# Patient Record
Sex: Male | Born: 1970 | Race: White | Hispanic: No | Marital: Married | Smoking: Never smoker
Health system: Southern US, Community
[De-identification: ages and names within clinical notes are randomized; demographics above are authoritative.]

## PROBLEM LIST (undated history)

## (undated) DIAGNOSIS — M1990 Unspecified osteoarthritis, unspecified site: Secondary | ICD-10-CM

---

## 2016-06-19 ENCOUNTER — Encounter
Admission: RE | Admit: 2016-06-19 | Discharge: 2016-06-19 | Disposition: A | Discharge status: Home / Self Care | Payer: BLUE CROSS/BLUE SHIELD | Source: Ambulatory Visit | Attending: Orthopedic Surgery | Admitting: Orthopedic Surgery

## 2016-06-19 ENCOUNTER — Other Ambulatory Visit: Payer: Self-pay

## 2016-06-19 DIAGNOSIS — Z01812 Encounter for preprocedural laboratory examination: Secondary | ICD-10-CM | POA: Diagnosis present

## 2016-06-19 HISTORY — DX: Unspecified osteoarthritis, unspecified site: M19.90

## 2016-06-19 LAB — COMPREHENSIVE METABOLIC PANEL
ALBUMIN: 4.8 g/dL (ref 3.5–5.0)
ALK PHOS: 64 U/L (ref 38–126)
ALT: 26 U/L (ref 17–63)
ANION GAP: 8 (ref 5–15)
AST: 27 U/L (ref 15–41)
BILIRUBIN TOTAL: 0.7 mg/dL (ref 0.3–1.2)
BUN: 14 mg/dL (ref 6–20)
CALCIUM: 9.9 mg/dL (ref 8.9–10.3)
CO2: 25 mmol/L (ref 22–32)
Chloride: 105 mmol/L (ref 101–111)
Creatinine, Ser: 0.92 mg/dL (ref 0.61–1.24)
Glucose, Bld: 93 mg/dL (ref 65–99)
POTASSIUM: 4.1 mmol/L (ref 3.5–5.1)
Sodium: 138 mmol/L (ref 135–145)
TOTAL PROTEIN: 7.7 g/dL (ref 6.5–8.1)

## 2016-06-19 LAB — CBC
HEMATOCRIT: 45.4 % (ref 40.0–52.0)
HEMOGLOBIN: 15.7 g/dL (ref 13.0–18.0)
MCH: 31.5 pg (ref 26.0–34.0)
MCHC: 34.5 g/dL (ref 32.0–36.0)
MCV: 91.3 fL (ref 80.0–100.0)
Platelets: 270 10*3/uL (ref 150–440)
RBC: 4.97 MIL/uL (ref 4.40–5.90)
RDW: 13.4 % (ref 11.5–14.5)
WBC: 7.5 10*3/uL (ref 3.8–10.6)

## 2016-06-19 LAB — SURGICAL PCR SCREEN
MRSA, PCR: NEGATIVE
Staphylococcus aureus: POSITIVE — AB

## 2016-06-19 LAB — TYPE AND SCREEN
ABO/RH(D): O POS
ANTIBODY SCREEN: NEGATIVE

## 2016-06-19 LAB — PROTIME-INR
INR: 0.97
PROTHROMBIN TIME: 12.9 s (ref 11.4–15.2)

## 2016-06-19 LAB — APTT: APTT: 33 s (ref 24–36)

## 2016-06-19 LAB — SEDIMENTATION RATE: SED RATE: 3 mm/h (ref 0–15)

## 2016-06-19 LAB — C-REACTIVE PROTEIN: CRP: 1.3 mg/dL — AB (ref <1.0)

## 2016-06-19 NOTE — Patient Instructions (Signed)
  Your procedure is scheduled on: July 03, 2016 (Wednesday) Report to Same Day Surgery 2nd floor medical mall [facility](Medical Mall Entrance-take elevator on left to 2nd floor.  Check in with surgery information desk.) To find out your arrival time please call [phone] between 1PM - 3PM on  July 02, 2016 (Tuesday)  Remember: Instructions that are not followed completely may result in serious medical risk, up to and including death, or upon the discretion of your surgeon and anesthesiologist your surgery may need to be rescheduled.    _x___ 1. Do not eat food or drink liquids after midnight. No gum chewing or hard candies.     __x__ 2. No Alcohol for 24 hours before or after surgery.   __x__3. No Smoking for 24 prior to surgery.   ____  4. Bring all medications with you on the day of surgery if instructed.    __x__ 5. Notify your doctor if there is any change in your medical condition     (cold, fever, infections).     Do not wear jewelry, make-up, hairpins, clips or nail polish.  Do not wear lotions, powders, or perfumes. You may wear deodorant.  Do not shave 48 hours prior to surgery. Men may shave face and neck.  Do not bring valuables to the hospital.    [facility] is not responsible for any belongings or valuables.               Contacts, dentures or bridgework may not be worn into surgery.  Leave your suitcase in the car. After surgery it may be brought to your room.  For patients admitted to the hospital, discharge time is determined by your treatment team.   Patients discharged the day of surgery will not be allowed to drive home.  You will need someone to drive you home and stay with you the night of your procedure.    Please read over the following fact sheets that you were given:   [facility] Preparing for Surgery and or MRSA Information   __ Take these medicines the morning of surgery with A SIP OF WATER:    1.   2.  3.  4.  5.  6.  ____Fleets enema or  Magnesium Citrate as directed.   _x___ Use CHG Soap or sage wipes as directed on instruction sheet   ____ Use inhalers on the day of surgery and bring to hospital day of surgery  ____ Stop metformin 2 days prior to surgery    ____ Take 1/2 of usual insulin dose the night before surgery and none on the morning of surgery.          __x__ Stop Aspirin, Coumadin, Pllavix ,Eliquis, Effient, or Pradaxa (NO ASPIRIN)  x__ Stop Anti-inflammatories such as Advil, Aleve, Ibuprofen, Motrin, Naproxen,          Naprosyn, Goodies powders or aspirin products. Ok to take Tylenol.   ____ Stop supplements until after surgery.    ____ Bring C-Pap to the hospital.

## 2016-06-20 LAB — URINALYSIS, ROUTINE W REFLEX MICROSCOPIC
BILIRUBIN URINE: NEGATIVE
Bacteria, UA: NONE SEEN
Glucose, UA: NEGATIVE mg/dL
Ketones, ur: NEGATIVE mg/dL
Leukocytes, UA: NEGATIVE
NITRITE: NEGATIVE
PH: 5 (ref 5.0–8.0)
Protein, ur: NEGATIVE mg/dL
RBC / HPF: NONE SEEN RBC/hpf (ref 0–5)
SPECIFIC GRAVITY, URINE: 1.011 (ref 1.005–1.030)

## 2016-06-20 LAB — URINE CULTURE
CULTURE: NO GROWTH
SPECIAL REQUESTS: NORMAL

## 2016-06-20 NOTE — Pre-Procedure Instructions (Signed)
CALLED POSITIVE STAPH TO TIFFANY AT DR Elenor LegatoHOOTEN,S

## 2016-06-21 NOTE — Pre-Procedure Instructions (Signed)
UA sent to Dr. Hooten for review. 

## 2016-07-02 MED ORDER — CEFAZOLIN SODIUM-DEXTROSE 2-4 GM/100ML-% IV SOLN
2.0000 g | INTRAVENOUS | Status: AC
  Administered 2016-07-03: 2 g via INTRAVENOUS

## 2016-07-02 MED ORDER — TRANEXAMIC ACID 1000 MG/10ML IV SOLN
1000.0000 mg | INTRAVENOUS | Status: AC
  Administered 2016-07-03: 1000 mg via INTRAVENOUS
  Filled 2016-07-02: qty 10

## 2016-07-03 ENCOUNTER — Encounter: Payer: Self-pay | Admitting: *Deleted

## 2016-07-03 ENCOUNTER — Inpatient Hospital Stay
Admission: RE | Admit: 2016-07-03 | Discharge: 2016-07-05 | DRG: 470 | Disposition: A | Discharge status: Home Health | Payer: BLUE CROSS/BLUE SHIELD | Source: Ambulatory Visit | Attending: Orthopedic Surgery | Admitting: Orthopedic Surgery

## 2016-07-03 ENCOUNTER — Inpatient Hospital Stay: Payer: BLUE CROSS/BLUE SHIELD | Admitting: Anesthesiology

## 2016-07-03 ENCOUNTER — Inpatient Hospital Stay: Payer: BLUE CROSS/BLUE SHIELD

## 2016-07-03 ENCOUNTER — Encounter
Admission: RE | Disposition: A | Discharge status: Home Health | Payer: Self-pay | Source: Ambulatory Visit | Attending: Orthopedic Surgery

## 2016-07-03 DIAGNOSIS — R262 Difficulty in walking, not elsewhere classified: Secondary | ICD-10-CM

## 2016-07-03 DIAGNOSIS — M1611 Unilateral primary osteoarthritis, right hip: Secondary | ICD-10-CM | POA: Diagnosis present

## 2016-07-03 DIAGNOSIS — M6281 Muscle weakness (generalized): Secondary | ICD-10-CM

## 2016-07-03 DIAGNOSIS — M109 Gout, unspecified: Secondary | ICD-10-CM | POA: Diagnosis present

## 2016-07-03 DIAGNOSIS — I493 Ventricular premature depolarization: Secondary | ICD-10-CM | POA: Diagnosis not present

## 2016-07-03 DIAGNOSIS — Z96649 Presence of unspecified artificial hip joint: Secondary | ICD-10-CM

## 2016-07-03 HISTORY — PX: TOTAL HIP ARTHROPLASTY: SHX124

## 2016-07-03 LAB — ABO/RH: ABO/RH(D): O POS

## 2016-07-03 SURGERY — ARTHROPLASTY, HIP, TOTAL,POSTERIOR APPROACH
Anesthesia: Spinal | Site: Hip | Laterality: Right | Wound class: Clean

## 2016-07-03 MED ORDER — TETRACAINE HCL 1 % IJ SOLN
INTRAMUSCULAR | Status: DC | PRN
  Administered 2016-07-03: 10 mg via INTRASPINAL

## 2016-07-03 MED ORDER — ONDANSETRON HCL 4 MG/2ML IJ SOLN: 4.0000 mg | Freq: Four times a day (QID) | INTRAMUSCULAR | Status: DC | PRN

## 2016-07-03 MED ORDER — MORPHINE SULFATE (PF) 2 MG/ML IV SOLN
2.0000 mg | INTRAVENOUS | Status: DC | PRN
  Administered 2016-07-03: 2 mg via INTRAVENOUS
  Filled 2016-07-03: qty 1

## 2016-07-03 MED ORDER — FENTANYL CITRATE (PF) 100 MCG/2ML IJ SOLN
INTRAMUSCULAR | Status: DC | PRN
  Administered 2016-07-03 (×2): 50 ug via INTRAVENOUS

## 2016-07-03 MED ORDER — ENOXAPARIN SODIUM 30 MG/0.3ML ~~LOC~~ SOLN
30.0000 mg | Freq: Two times a day (BID) | SUBCUTANEOUS | Status: DC
  Administered 2016-07-04 – 2016-07-05 (×3): 30 mg via SUBCUTANEOUS
  Filled 2016-07-03 (×3): qty 0.3

## 2016-07-03 MED ORDER — LIDOCAINE HCL (PF) 2 % IJ SOLN
INTRAMUSCULAR | Status: DC | PRN
  Administered 2016-07-03: 100 mg
  Administered 2016-07-03: 50 mg

## 2016-07-03 MED ORDER — PROPOFOL 500 MG/50ML IV EMUL
INTRAVENOUS | Status: DC | PRN
  Administered 2016-07-03: 50 ug/kg/min via INTRAVENOUS

## 2016-07-03 MED ORDER — ACETAMINOPHEN 10 MG/ML IV SOLN
INTRAVENOUS | Status: AC
  Filled 2016-07-03: qty 100

## 2016-07-03 MED ORDER — ACETAMINOPHEN 10 MG/ML IV SOLN
INTRAVENOUS | Status: DC | PRN
  Administered 2016-07-03: 1000 mg via INTRAVENOUS

## 2016-07-03 MED ORDER — BISACODYL 10 MG RE SUPP: 10.0000 mg | Freq: Every day | RECTAL | Status: DC | PRN

## 2016-07-03 MED ORDER — FERROUS SULFATE 325 (65 FE) MG PO TABS
325.0000 mg | ORAL_TABLET | Freq: Two times a day (BID) | ORAL | Status: DC
  Administered 2016-07-03 – 2016-07-05 (×4): 325 mg via ORAL
  Filled 2016-07-03 (×4): qty 1

## 2016-07-03 MED ORDER — PROPOFOL 500 MG/50ML IV EMUL
INTRAVENOUS | Status: AC
  Filled 2016-07-03: qty 50

## 2016-07-03 MED ORDER — ALUM & MAG HYDROXIDE-SIMETH 200-200-20 MG/5ML PO SUSP: 30.0000 mL | ORAL | Status: DC | PRN

## 2016-07-03 MED ORDER — MIDAZOLAM HCL 2 MG/2ML IJ SOLN
INTRAMUSCULAR | Status: AC
  Filled 2016-07-03: qty 2

## 2016-07-03 MED ORDER — PROMETHAZINE HCL 25 MG/ML IJ SOLN: 6.2500 mg | INTRAMUSCULAR | Status: DC | PRN

## 2016-07-03 MED ORDER — GLYCOPYRROLATE 0.2 MG/ML IJ SOLN
INTRAMUSCULAR | Status: DC | PRN
  Administered 2016-07-03: 0.2 mg via INTRAVENOUS
  Administered 2016-07-03: 0.1 mg via INTRAVENOUS

## 2016-07-03 MED ORDER — OXYCODONE HCL 5 MG PO TABS
5.0000 mg | ORAL_TABLET | ORAL | Status: DC | PRN
  Administered 2016-07-03 (×2): 10 mg via ORAL
  Administered 2016-07-03: 5 mg via ORAL
  Administered 2016-07-04 – 2016-07-05 (×5): 10 mg via ORAL
  Filled 2016-07-03: qty 2
  Filled 2016-07-03: qty 1
  Filled 2016-07-03 (×6): qty 2

## 2016-07-03 MED ORDER — CHLORHEXIDINE GLUCONATE 4 % EX LIQD: 60.0000 mL | Freq: Once | CUTANEOUS | Status: DC

## 2016-07-03 MED ORDER — TETRACAINE HCL 1 % IJ SOLN
INTRAMUSCULAR | Status: AC
  Filled 2016-07-03: qty 2

## 2016-07-03 MED ORDER — METOCLOPRAMIDE HCL 10 MG PO TABS
10.0000 mg | ORAL_TABLET | Freq: Three times a day (TID) | ORAL | Status: DC
  Administered 2016-07-03 – 2016-07-05 (×7): 10 mg via ORAL
  Filled 2016-07-03 (×6): qty 1

## 2016-07-03 MED ORDER — FENTANYL CITRATE (PF) 100 MCG/2ML IJ SOLN
INTRAMUSCULAR | Status: AC
  Filled 2016-07-03: qty 2

## 2016-07-03 MED ORDER — OXYCODONE HCL 5 MG/5ML PO SOLN: 5.0000 mg | Freq: Once | ORAL | Status: DC | PRN

## 2016-07-03 MED ORDER — FLEET ENEMA 7-19 GM/118ML RE ENEM: 1.0000 | ENEMA | Freq: Once | RECTAL | Status: DC | PRN

## 2016-07-03 MED ORDER — ACETAMINOPHEN 325 MG PO TABS: 650.0000 mg | ORAL_TABLET | Freq: Four times a day (QID) | ORAL | Status: DC | PRN

## 2016-07-03 MED ORDER — NEOMYCIN-POLYMYXIN B GU 40-200000 IR SOLN
Status: AC
  Filled 2016-07-03: qty 20

## 2016-07-03 MED ORDER — PHENYLEPHRINE HCL 10 MG/ML IJ SOLN
INTRAMUSCULAR | Status: DC | PRN
  Administered 2016-07-03 (×2): 100 ug via INTRAVENOUS

## 2016-07-03 MED ORDER — LACTATED RINGERS IV SOLN
INTRAVENOUS | Status: DC
  Administered 2016-07-03 (×3): via INTRAVENOUS

## 2016-07-03 MED ORDER — TRAMADOL HCL 50 MG PO TABS
50.0000 mg | ORAL_TABLET | ORAL | Status: DC | PRN
  Administered 2016-07-03 – 2016-07-05 (×3): 100 mg via ORAL
  Filled 2016-07-03 (×3): qty 2

## 2016-07-03 MED ORDER — FENTANYL CITRATE (PF) 100 MCG/2ML IJ SOLN
25.0000 ug | INTRAMUSCULAR | Status: DC | PRN
  Administered 2016-07-03 (×2): 50 ug via INTRAVENOUS

## 2016-07-03 MED ORDER — CELECOXIB 200 MG PO CAPS
200.0000 mg | ORAL_CAPSULE | Freq: Two times a day (BID) | ORAL | Status: DC
  Administered 2016-07-03 – 2016-07-05 (×5): 200 mg via ORAL
  Filled 2016-07-03 (×5): qty 1

## 2016-07-03 MED ORDER — BUPIVACAINE HCL (PF) 0.5 % IJ SOLN
INTRAMUSCULAR | Status: DC | PRN
  Administered 2016-07-03: 2 mL via INTRATHECAL

## 2016-07-03 MED ORDER — PANTOPRAZOLE SODIUM 40 MG PO TBEC
40.0000 mg | DELAYED_RELEASE_TABLET | Freq: Two times a day (BID) | ORAL | Status: DC
  Administered 2016-07-03 – 2016-07-05 (×5): 40 mg via ORAL
  Filled 2016-07-03 (×5): qty 1

## 2016-07-03 MED ORDER — NEOMYCIN-POLYMYXIN B GU 40-200000 IR SOLN
Status: DC | PRN
  Administered 2016-07-03: 14 mL

## 2016-07-03 MED ORDER — FAMOTIDINE 20 MG PO TABS
20.0000 mg | ORAL_TABLET | Freq: Once | ORAL | Status: AC
  Administered 2016-07-03: 20 mg via ORAL

## 2016-07-03 MED ORDER — OXYCODONE HCL 5 MG PO TABS: 5.0000 mg | ORAL_TABLET | Freq: Once | ORAL | Status: DC | PRN

## 2016-07-03 MED ORDER — LIDOCAINE 2% (20 MG/ML) 5 ML SYRINGE
INTRAMUSCULAR | Status: AC
  Filled 2016-07-03: qty 5

## 2016-07-03 MED ORDER — MAGNESIUM HYDROXIDE 400 MG/5ML PO SUSP
30.0000 mL | Freq: Every day | ORAL | Status: DC | PRN
  Administered 2016-07-05: 30 mL via ORAL
  Filled 2016-07-03: qty 30

## 2016-07-03 MED ORDER — ACETAMINOPHEN 650 MG RE SUPP: 650.0000 mg | Freq: Four times a day (QID) | RECTAL | Status: DC | PRN

## 2016-07-03 MED ORDER — DIPHENHYDRAMINE HCL 12.5 MG/5ML PO ELIX: 12.5000 mg | ORAL_SOLUTION | ORAL | Status: DC | PRN

## 2016-07-03 MED ORDER — ONDANSETRON HCL 4 MG PO TABS: 4.0000 mg | ORAL_TABLET | Freq: Four times a day (QID) | ORAL | Status: DC | PRN

## 2016-07-03 MED ORDER — PHENYLEPHRINE HCL 10 MG/ML IJ SOLN
INTRAMUSCULAR | Status: AC
  Filled 2016-07-03: qty 1

## 2016-07-03 MED ORDER — FENTANYL CITRATE (PF) 100 MCG/2ML IJ SOLN
INTRAMUSCULAR | Status: AC
  Administered 2016-07-03: 50 ug via INTRAVENOUS
  Filled 2016-07-03: qty 2

## 2016-07-03 MED ORDER — EPHEDRINE SULFATE 50 MG/ML IJ SOLN
INTRAMUSCULAR | Status: DC | PRN
  Administered 2016-07-03: 10 mg via INTRAVENOUS

## 2016-07-03 MED ORDER — MIDAZOLAM HCL 5 MG/5ML IJ SOLN
INTRAMUSCULAR | Status: DC | PRN
  Administered 2016-07-03 (×2): 1 mg via INTRAVENOUS
  Administered 2016-07-03: 2 mg via INTRAVENOUS

## 2016-07-03 MED ORDER — SODIUM CHLORIDE 0.9 % IV SOLN
INTRAVENOUS | Status: DC
  Administered 2016-07-03 – 2016-07-04 (×3): via INTRAVENOUS

## 2016-07-03 MED ORDER — SODIUM CHLORIDE 0.9 % IV SOLN: INTRAVENOUS | Status: DC | PRN

## 2016-07-03 MED ORDER — GLYCOPYRROLATE 0.2 MG/ML IJ SOLN
INTRAMUSCULAR | Status: AC
  Filled 2016-07-03: qty 1

## 2016-07-03 MED ORDER — ACETAMINOPHEN 10 MG/ML IV SOLN
1000.0000 mg | Freq: Four times a day (QID) | INTRAVENOUS | Status: AC
  Administered 2016-07-03 – 2016-07-04 (×4): 1000 mg via INTRAVENOUS
  Filled 2016-07-03 (×4): qty 100

## 2016-07-03 MED ORDER — BUPIVACAINE HCL (PF) 0.5 % IJ SOLN
INTRAMUSCULAR | Status: AC
  Filled 2016-07-03: qty 30

## 2016-07-03 MED ORDER — CEFAZOLIN SODIUM-DEXTROSE 2-4 GM/100ML-% IV SOLN
2.0000 g | Freq: Four times a day (QID) | INTRAVENOUS | Status: AC
  Administered 2016-07-03 – 2016-07-04 (×3): 2 g via INTRAVENOUS
  Filled 2016-07-03 (×4): qty 100

## 2016-07-03 MED ORDER — EPHEDRINE 5 MG/ML INJ
INTRAVENOUS | Status: AC
  Filled 2016-07-03: qty 10

## 2016-07-03 MED ORDER — PHENOL 1.4 % MT LIQD
1.0000 | OROMUCOSAL | Status: DC | PRN
  Filled 2016-07-03: qty 177

## 2016-07-03 MED ORDER — TRANEXAMIC ACID 1000 MG/10ML IV SOLN
1000.0000 mg | Freq: Once | INTRAVENOUS | Status: AC
  Administered 2016-07-03: 1000 mg via INTRAVENOUS
  Filled 2016-07-03: qty 10

## 2016-07-03 MED ORDER — ALLOPURINOL 100 MG PO TABS
300.0000 mg | ORAL_TABLET | Freq: Every day | ORAL | Status: DC
  Administered 2016-07-03 – 2016-07-04 (×2): 300 mg via ORAL
  Filled 2016-07-03 (×2): qty 3

## 2016-07-03 MED ORDER — SENNOSIDES-DOCUSATE SODIUM 8.6-50 MG PO TABS
1.0000 | ORAL_TABLET | Freq: Two times a day (BID) | ORAL | Status: DC
  Administered 2016-07-03 – 2016-07-05 (×5): 1 via ORAL
  Filled 2016-07-03 (×5): qty 1

## 2016-07-03 MED ORDER — MENTHOL 3 MG MT LOZG
1.0000 | LOZENGE | OROMUCOSAL | Status: DC | PRN
  Filled 2016-07-03: qty 9

## 2016-07-03 MED ORDER — MEPERIDINE HCL 25 MG/ML IJ SOLN: 6.2500 mg | INTRAMUSCULAR | Status: DC | PRN

## 2016-07-03 MED ORDER — PROPOFOL 10 MG/ML IV BOLUS
INTRAVENOUS | Status: DC | PRN
  Administered 2016-07-03: 20 mg via INTRAVENOUS

## 2016-07-03 MED ORDER — PROPOFOL 10 MG/ML IV BOLUS
INTRAVENOUS | Status: AC
  Filled 2016-07-03: qty 20

## 2016-07-03 SURGICAL SUPPLY — 50 items
BLADE DRUM FLTD (BLADE) ×3 IMPLANT
BLADE SAW 1 (BLADE) ×3 IMPLANT
CANISTER SUCT 1200ML W/VALVE (MISCELLANEOUS) ×3 IMPLANT
CANISTER SUCT 3000ML (MISCELLANEOUS) ×6 IMPLANT
CAPT HIP TOTAL 2 ×3 IMPLANT
CARTRIDGE OIL MAESTRO DRILL (MISCELLANEOUS) ×1 IMPLANT
CATH FOL LEG HOLDER (MISCELLANEOUS) ×3 IMPLANT
CATH TRAY METER 16FR LF (MISCELLANEOUS) ×3 IMPLANT
DIFFUSER MAESTRO (MISCELLANEOUS) ×3 IMPLANT
DRAPE INCISE IOBAN 66X60 STRL (DRAPES) ×3 IMPLANT
DRAPE SHEET LG 3/4 BI-LAMINATE (DRAPES) ×3 IMPLANT
DRSG DERMACEA 8X12 NADH (GAUZE/BANDAGES/DRESSINGS) ×3 IMPLANT
DRSG OPSITE POSTOP 3X4 (GAUZE/BANDAGES/DRESSINGS) ×3 IMPLANT
DRSG OPSITE POSTOP 4X12 (GAUZE/BANDAGES/DRESSINGS) ×3 IMPLANT
DRSG OPSITE POSTOP 4X14 (GAUZE/BANDAGES/DRESSINGS) ×3 IMPLANT
DRSG TEGADERM 4X4.75 (GAUZE/BANDAGES/DRESSINGS) ×3 IMPLANT
DURAPREP 26ML APPLICATOR (WOUND CARE) ×3 IMPLANT
ELECT BLADE 6.5 EXT (BLADE) ×3 IMPLANT
ELECT CAUTERY BLADE 6.4 (BLADE) ×3 IMPLANT
GLOVE BIOGEL M STRL SZ7.5 (GLOVE) ×6 IMPLANT
GLOVE INDICATOR 8.0 STRL GRN (GLOVE) ×3 IMPLANT
GLOVE SURG 9.0 ORTHO LTXF (GLOVE) ×3 IMPLANT
GLOVE SURG ORTHO 9.0 STRL STRW (GLOVE) ×3 IMPLANT
GOWN STRL REUS W/ TWL LRG LVL3 (GOWN DISPOSABLE) ×2 IMPLANT
GOWN STRL REUS W/TWL 2XL LVL3 (GOWN DISPOSABLE) ×3 IMPLANT
GOWN STRL REUS W/TWL LRG LVL3 (GOWN DISPOSABLE) ×4
HANDPIECE INTERPULSE COAX TIP (DISPOSABLE) ×2
HEMOVAC 400CC 10FR (MISCELLANEOUS) ×3 IMPLANT
HOOD PEEL AWAY FLYTE STAYCOOL (MISCELLANEOUS) ×6 IMPLANT
KIT RM TURNOVER STRD PROC AR (KITS) ×3 IMPLANT
NDL SAFETY 18GX1.5 (NEEDLE) ×3 IMPLANT
NS IRRIG 500ML POUR BTL (IV SOLUTION) ×3 IMPLANT
OIL CARTRIDGE MAESTRO DRILL (MISCELLANEOUS) ×3
PACK HIP PROSTHESIS (MISCELLANEOUS) ×3 IMPLANT
SET HNDPC FAN SPRY TIP SCT (DISPOSABLE) ×1 IMPLANT
SOL .9 NS 3000ML IRR  AL (IV SOLUTION) ×2
SOL .9 NS 3000ML IRR UROMATIC (IV SOLUTION) ×1 IMPLANT
SOL PREP PVP 2OZ (MISCELLANEOUS) ×3
SOLUTION PREP PVP 2OZ (MISCELLANEOUS) ×1 IMPLANT
SPONGE DRAIN TRACH 4X4 STRL 2S (GAUZE/BANDAGES/DRESSINGS) ×3 IMPLANT
STAPLER SKIN PROX 35W (STAPLE) ×3 IMPLANT
SUT ETHIBOND #5 BRAIDED 30INL (SUTURE) ×3 IMPLANT
SUT VIC AB 0 CT1 36 (SUTURE) ×3 IMPLANT
SUT VIC AB 1 CT1 36 (SUTURE) ×6 IMPLANT
SUT VIC AB 2-0 CT1 27 (SUTURE) ×2
SUT VIC AB 2-0 CT1 TAPERPNT 27 (SUTURE) ×1 IMPLANT
SYR 20CC LL (SYRINGE) ×3 IMPLANT
TAPE ADH 3 LX (MISCELLANEOUS) ×3 IMPLANT
TAPE TRANSPORE STRL 2 31045 (GAUZE/BANDAGES/DRESSINGS) ×3 IMPLANT
TOWEL OR 17X26 4PK STRL BLUE (TOWEL DISPOSABLE) ×3 IMPLANT

## 2016-07-03 NOTE — Progress Notes (Signed)
Dr. Ernest PineHooten called and asked that I discontinue the order for removal of foley day one. Will probably be removing foley post op 48 hrs.

## 2016-07-03 NOTE — Brief Op Note (Signed)
07/03/2016  11:16 AM  PATIENT:  Jake Robinson  45 y.o. male  PRE-OPERATIVE DIAGNOSIS:  PRIMARY OSTEOARTHRITIS of the right hip  POST-OPERATIVE DIAGNOSIS:  Same  PROCEDURE:  Procedure(s): TOTAL HIP ARTHROPLASTY (Right)  SURGEON:  Surgeon(s) and Role:    * Donato HeinzJames P Tauna Macfarlane, MD - Primary  ASSISTANTS: Van ClinesJon Wolfe, PA   ANESTHESIA:   spinal  EBL:  Total I/O In: 1700 [I.V.:1700] Out: 400 [Urine:200; Blood:200]  BLOOD ADMINISTERED:none  DRAINS: 2 medium Hemovac   LOCAL MEDICATIONS USED:  NONE  SPECIMEN:  Source of Specimen:  Right femoral head  DISPOSITION OF SPECIMEN:  PATHOLOGY  COUNTS:  YES  TOURNIQUET:  * No tourniquets in log *  DICTATION: .Dragon Dictation  PLAN OF CARE: Admit to inpatient   PATIENT DISPOSITION:  PACU - hemodynamically stable.   Delay start of Pharmacological VTE agent (>24hrs) due to surgical blood loss or risk of bleeding: yes

## 2016-07-03 NOTE — OR Nursing (Signed)
Upon arrival palpation of pulse, regular irregularity noted. Dr. Priscella MannPenwarden notified. Placed on  Cardiac monitor, EKG strip placed in the chart.

## 2016-07-03 NOTE — H&P (Signed)
The patient has been re-examined, and the chart reviewed, and there have been no interval changes to the documented history and physical.    The risks, benefits, and alternatives have been discussed at length. The patient expressed understanding of the risks benefits and agreed with plans for surgical intervention.  Shayli Altemose P. Dyani Babel, Jr. M.D.    

## 2016-07-03 NOTE — Anesthesia Procedure Notes (Addendum)
Spinal  Patient location during procedure: OR Start time: 07/03/2016 7:19 AM End time: 07/03/2016 7:24 AM Staffing Anesthesiologist: Randa Lynn AMY Resident/CRNA: Rolla Plate Performed: resident/CRNA  Preanesthetic Checklist Completed: patient identified, site marked, surgical consent, pre-op evaluation, timeout performed, IV checked, risks and benefits discussed and monitors and equipment checked Spinal Block Patient position: sitting Prep: ChloraPrep Patient monitoring: heart rate, continuous pulse ox, blood pressure and cardiac monitor Approach: midline Location: L4-5 Injection technique: single-shot Needle Needle type: Whitacre and Introducer  Needle gauge: 24 G Needle length: 9 cm Additional Notes Negative paresthesia. Negative blood return. Positive free-flowing CSF. Expiration date of kit checked and confirmed. Patient tolerated procedure well, without complications.

## 2016-07-03 NOTE — Op Note (Signed)
OPERATIVE NOTE  DATE OF SURGERY:  07/03/2016  PATIENT NAME:  Jake Robinson   DOB: 05/13/1971  MRN: [id number]  PRE-OPERATIVE DIAGNOSIS: Degenerative arthrosis of the right hip, primary  POST-OPERATIVE DIAGNOSIS:  Same  PROCEDURE:  Right total hip arthroplasty  SURGEON:  Jena GaussJames P Jenicka Coxe, Jr. M.D.  ASSISTANT:  Van ClinesJon Wolfe, PA (present and scrubbed throughout the case, critical for assistance with exposure, retraction, instrumentation, and closure)  ANESTHESIA: spinal  ESTIMATED BLOOD LOSS: 200 mL  FLUIDS REPLACED: 1700 mL of crystalloid  DRAINS: 2 medium drains to a Hemovac reservoir  IMPLANTS UTILIZED: DePuy 15 mm small stature AML femoral stem, 56 mm OD Pinnacle 100 acetabular component, neutral Pinnacle Marathon polyethylene insert, and a 36 mm M-SPEC +1.5 mm hip ball  INDICATIONS FOR SURGERY: Jake Robinson is a 45 y.o. year old male with a long history of progressive hip and groin  pain. X-rays demonstrated severe degenerative changes. The patient had not seen any significant improvement despite conservative nonsurgical intervention. After discussion of the risks and benefits of surgical intervention, the patient expressed understanding of the risks benefits and agree with plans for total hip arthroplasty.   The risks, benefits, and alternatives were discussed at length including but not limited to the risks of infection, bleeding, nerve injury, stiffness, blood clots, the need for revision surgery, limb length inequality, dislocation, cardiopulmonary complications, among others, and they were willing to proceed.  PROCEDURE IN DETAIL: The patient was brought into the operating room and, after adequate spinal anesthesia was achieved, the patient was placed in a left lateral decubitus position. Axillary roll was placed and all bony prominences were well-padded. The patient's right hip was cleaned and prepped with alcohol and DuraPrep and draped in the usual sterile fashion. A "timeout" was  performed as per usual protocol. A lateral curvilinear incision was made gently curving towards the posterior superior iliac spine. The IT band was incised in line with the skin incision and the fibers of the gluteus maximus were split in line. The piriformis tendon was identified, skeletonized, and incised at its insertion to the proximal femur and reflected posteriorly. A T type posterior capsulotomy was performed. Prior to dislocation of the femoral head, a threaded Steinmann pin was inserted through a separate stab incision into the pelvis superior to the acetabulum and bent in the form of a stylus so as to assess limb length and hip offset throughout the procedure. The femoral head was then dislocated posteriorly. Inspection of the femoral head demonstrated severe degenerative changes with full-thickness loss of articular cartilage. The femoral neck cut was performed using an oscillating saw. The anterior capsule was elevated off of the femoral neck using a periosteal elevator. Attention was then directed to the acetabulum. The remnant of the labrum was excised using electrocautery. Inspection of the acetabulum also demonstrated significant degenerative changes. The acetabulum was reamed in sequential fashion up to a 55 mm diameter. Good punctate bleeding bone was encountered. A 56 mm Pinnacle 100 acetabular component was positioned and impacted into place. Good scratch fit was appreciated. A neutral polyethylene trial was inserted.  Attention was then directed to the proximal femur. A hole for reaming of the proximal femoral canal was created using a high-speed burr. The femoral canal was reamed in sequential fashion up to a 14.5 mm diameter. This allowed for over 6 cm of scratch fit. It was thus elected to ream to a 50 mm diameter to allow for a line to line fit. Serial broaches were inserted up to  a 15 mm small stature femoral broach. Calcar region was planed and a trial reduction was performed using a 36  mm hip ball with a +1.5 mm neck length. Good equalization of limb lengths and hip offset was appreciated and excellent stability was noted both anteriorly and posteriorly. Trial components were removed. The acetabular shell was irrigated with copious amounts of normal saline with antibiotic solution and suctioned dry. A neutral Pinnacle Marathon polyethylene insert was positioned and impacted into place. Next, a 15 mm small stature AML femoral stem was positioned and impacted into place. Excellent scratch fit was appreciated. A trial reduction was again performed with a 36 mm hip ball with a +1.5 mm neck length. Again, good equalization of limb lengths was appreciated and excellent stability appreciated both anteriorly and posteriorly. The hip was then dislocated and the trial hip ball was removed. The Morse taper was cleaned and dried. A 36 mm M-SPEC hip ball with a +1.5 mm neck length was placed on the trunnion and impacted into place. The hip was then reduced and placed through range of motion. Excellent stability was appreciated both anteriorly and posteriorly.  The wound was irrigated with copious amounts of normal saline with antibiotic solution and suctioned dry. Good hemostasis was appreciated. The posterior capsulotomy was repaired using #5 Ethibond. Piriformis tendon was reapproximated to the undersurface of the gluteus medius tendon using #5 Ethibond. Two medium drains were placed in the wound bed and brought out through separate stab incisions to be attached to a Hemovac reservoir. The IT band was reapproximated using interrupted sutures of #1 Vicryl. Subcutaneous tissue was approximated using first #0 Vicryl followed by #2-0 Vicryl. The skin was closed with skin staples.  The patient tolerated the procedure well and was transported to the recovery room in stable condition.   Jena GaussJames P Breyon Sigg, Jr., M.D.

## 2016-07-03 NOTE — Anesthesia Preprocedure Evaluation (Signed)
Anesthesia Evaluation  Patient identified by MRN, date of birth, ID band Patient awake    Reviewed: Allergy & Precautions, NPO status , Patient's Chart, lab work & pertinent test results  History of Anesthesia Complications Negative for: history of anesthetic complications  Airway Mallampati: II  TM Distance: >3 FB Neck ROM: Full    Dental no notable dental hx.    Pulmonary neg pulmonary ROS, neg sleep apnea, neg COPD,    breath sounds clear to auscultation- rhonchi (-) wheezing      Cardiovascular Exercise Tolerance: Good (-) hypertension(-) CAD and (-) Past MI  Rhythm:Regular Rate:Normal - Systolic murmurs and - Diastolic murmurs    Neuro/Psych negative neurological ROS  negative psych ROS   GI/Hepatic negative GI ROS, Neg liver ROS,   Endo/Other  negative endocrine ROSneg diabetes  Renal/GU negative Renal ROS     Musculoskeletal  (+) Arthritis ,   Abdominal (+) - obese,   Peds  Hematology negative hematology ROS (+)   Anesthesia Other Findings   Reproductive/Obstetrics                             Anesthesia Physical Anesthesia Plan  ASA: II  Anesthesia Plan: Spinal   Post-op Pain Management:    Induction:   Airway Management Planned: Natural Airway  Additional Equipment:   Intra-op Plan:   Post-operative Plan:   Informed Consent: I have reviewed the patients History and Physical, chart, labs and discussed the procedure including the risks, benefits and alternatives for the proposed anesthesia with the patient or authorized representative who has indicated his/her understanding and acceptance.   Dental advisory given  Plan Discussed with: Anesthesiologist and CRNA  Anesthesia Plan Comments:         Lab Results  Component Value Date   WBC 7.5 06/19/2016   HGB 15.7 06/19/2016   HCT 45.4 06/19/2016   MCV 91.3 06/19/2016   PLT 270 06/19/2016    Anesthesia  Quick Evaluation

## 2016-07-03 NOTE — NC FL2 (Signed)
[place] MEDICAID FL2 LEVEL OF CARE SCREENING TOOL     IDENTIFICATION  Patient Name: Jake Robinson Birthdate: 07/16/1970 Sex: male Admission Date (Current Location): 07/03/2016  [place]   Facility and Address:  [facility], [address], [place], [place] [address]      Provider Number: [id number]  Attending Physician Name and Address:  Donato HeinzJames P Costa Jha, MD  Relative Name and Phone Number:       Current Level of Care: Hospital Recommended Level of Care: Skilled Nursing Facility Prior Approval Number:    Date Approved/Denied:   PASRR Number:  [id number] [id number])  Discharge Plan: SNF    Current Diagnoses: Patient Active Problem List   Diagnosis Date Noted  . S/P total hip arthroplasty 07/03/2016   Gout    Chicken pox       Orientation RESPIRATION BLADDER Height & Weight     Self, Time, Situation, Place  Normal Continent Weight: 210 lb (95.3 kg) Height:  6' (182.9 cm)  BEHAVIORAL SYMPTOMS/MOOD NEUROLOGICAL BOWEL NUTRITION STATUS   (none )  (none ) Continent Diet (Regular Diet )  AMBULATORY STATUS COMMUNICATION OF NEEDS Skin   Extensive Assist Verbally Surgical wounds (Incision: Right Hip. )                       Personal Care Assistance Level of Assistance  Bathing, Feeding, Dressing Bathing Assistance: Limited assistance Feeding assistance: Independent Dressing Assistance: Limited assistance     Functional Limitations Info  Sight, Hearing, Speech Sight Info: Adequate Hearing Info: Adequate Speech Info: Adequate    SPECIAL CARE FACTORS FREQUENCY  PT (By licensed PT), OT (By licensed OT)     PT Frequency:  (5) OT Frequency:  (5)            Contractures      Additional Factors Info  Code Status, Allergies Code Status Info:  (Full Code. ) Allergies Info:  (No Known Allergies. )           Current Medications (07/03/2016):  This is the current hospital active medication list Current  Facility-Administered Medications  Medication Dose Route Frequency Provider Last Rate Last Dose  . 0.9 %  sodium chloride infusion   Intravenous Continuous Donato HeinzJames P Haleemah Buckalew, MD 100 mL/hr at 07/03/16 1303    . acetaminophen (OFIRMEV) IV 1,000 mg  1,000 mg Intravenous Q6H Donato HeinzJames P Koal Eslinger, MD   1,000 mg at 07/03/16 1524  . acetaminophen (TYLENOL) tablet 650 mg  650 mg Oral Q6H PRN Donato HeinzJames P Aseel Uhde, MD       Or  . acetaminophen (TYLENOL) suppository 650 mg  650 mg Rectal Q6H PRN Donato HeinzJames P Nicloe Frontera, MD      . allopurinol (ZYLOPRIM) tablet 300 mg  300 mg Oral QPC supper Donato HeinzJames P Kemiah Booz, MD      . alum & mag hydroxide-simeth (MAALOX/MYLANTA) 200-200-20 MG/5ML suspension 30 mL  30 mL Oral Q4H PRN Donato HeinzJames P Kysean Sweet, MD      . bisacodyl (DULCOLAX) suppository 10 mg  10 mg Rectal Daily PRN Donato HeinzJames P Ashland Wiseman, MD      . ceFAZolin (ANCEF) IVPB 2g/100 mL premix  2 g Intravenous Q6H Donato HeinzJames P Omari Koslosky, MD   2 g at 07/03/16 1409  . celecoxib (CELEBREX) capsule 200 mg  200 mg Oral Q12H Donato HeinzJames P Chamaine Stankus, MD   200 mg at 07/03/16 1409  . diphenhydrAMINE (BENADRYL) 12.5 MG/5ML elixir 12.5-25 mg  12.5-25 mg Oral Q4H PRN Illene LabradorJames P  Alwaleed Obeso, MD      . Melene Muller[START ON 07/04/2016] enoxaparin (LOVENOX) injection 30 mg  30 mg Subcutaneous Q12H Donato HeinzJames P Carmon Sahli, MD      . ferrous sulfate tablet 325 mg  325 mg Oral BID WC Donato HeinzJames P Diella Gillingham, MD   325 mg at 07/03/16 1711  . magnesium hydroxide (MILK OF MAGNESIA) suspension 30 mL  30 mL Oral Daily PRN Donato HeinzJames P Tin Engram, MD      . menthol-cetylpyridinium (CEPACOL) lozenge 3 mg  1 lozenge Oral PRN Donato HeinzJames P Manuel Lawhead, MD       Or  . phenol (CHLORASEPTIC) mouth spray 1 spray  1 spray Mouth/Throat PRN Donato HeinzJames P Markcus Lazenby, MD      . metoCLOPramide (REGLAN) tablet 10 mg  10 mg Oral TID AC & HS Donato HeinzJames P Varnell Orvis, MD   10 mg at 07/03/16 1711  . morphine 2 MG/ML injection 2 mg  2 mg Intravenous Q2H PRN Donato HeinzJames P Ariana Juul, MD   2 mg at 07/03/16 1418  . ondansetron (ZOFRAN) tablet 4 mg  4 mg Oral Q6H PRN Donato HeinzJames P Janique Hoefer, MD       Or  .  ondansetron (ZOFRAN) injection 4 mg  4 mg Intravenous Q6H PRN Donato HeinzJames P Keshawn Fiorito, MD      . oxyCODONE (Oxy IR/ROXICODONE) immediate release tablet 5-10 mg  5-10 mg Oral Q4H PRN Donato HeinzJames P Dondrell Loudermilk, MD   10 mg at 07/03/16 1711  . pantoprazole (PROTONIX) EC tablet 40 mg  40 mg Oral BID Donato HeinzJames P Dany Walther, MD   40 mg at 07/03/16 1409  . senna-docusate (Senokot-S) tablet 1 tablet  1 tablet Oral BID Donato HeinzJames P Kyo Cocuzza, MD   1 tablet at 07/03/16 1409  . sodium phosphate (FLEET) 7-19 GM/118ML enema 1 enema  1 enema Rectal Once PRN Donato HeinzJames P Alexandru Moorer, MD      . traMADol Janean Sark(ULTRAM) tablet 50-100 mg  50-100 mg Oral Q4H PRN Donato HeinzJames P Jordi Kamm, MD         Discharge Medications: Please see discharge summary for a list of discharge medications.  Relevant Imaging Results:  Relevant Lab Results:   Additional Information  (SSN: [id number])  Sample, Darleen CrockerBailey M, LCSW

## 2016-07-03 NOTE — Transfer of Care (Signed)
Immediate Anesthesia Transfer of Care Note  Patient: Jake Robinson  Procedure(s) Performed: Procedure(s): TOTAL HIP ARTHROPLASTY (Right)  Patient Location: PACU  Anesthesia Type:Spinal  Level of Consciousness: sedated  Airway & Oxygen Therapy: Patient Spontanous Breathing and Patient connected to face mask oxygen  Post-op Assessment: Report given to RN and Post -op Vital signs reviewed and stable  Post vital signs: Reviewed and stable  Last Vitals:  Vitals:   07/03/16 0624  BP: (!) 141/73  Pulse: 60  Resp: 20  Temp: 36.5 C    Last Pain:  Vitals:   07/03/16 0624  TempSrc: Oral  PainSc: 4       Patients Stated Pain Goal: 2 (07/03/16 [id number])  Complications: No apparent anesthesia complications

## 2016-07-03 NOTE — Evaluation (Signed)
Physical Therapy Evaluation Patient Details Name: Jake Robinson MRN: [id number] DOB: 10/20/1970 Today's Date: 07/03/2016   History of Present Illness  45 y/o male s/p R total hip replacement 12/27  Clinical Impression  Pt is able to ambulate ~40 ft on POD0 and generally did well with POD0 PT exam.  He showed good strength and effort with ~10 minutes of exercises apart from PT exam and generally showed great motivation and effort.  He was eager to do all he could but did have some mild lightheadedness after ambulation.  (BP 99/56).  Overall pt did well and should be able to return home safely and w/o issue.     Follow Up Recommendations Home health PT    Equipment Recommendations  Rolling walker with 5" wheels    Recommendations for Other Services       Precautions / Restrictions Precautions Precautions: Posterior Hip Restrictions Weight Bearing Restrictions: Yes RLE Weight Bearing: Weight bearing as tolerated      Mobility  Bed Mobility Overal bed mobility: Needs Assistance Bed Mobility: Supine to Sit     Supine to sit: Min assist     General bed mobility comments: light assist with getting LEs safely to EOB w/in precautions  Transfers Overall transfer level: Modified independent Equipment used: Rolling walker (2 wheeled)             General transfer comment: Heavy use of UEs and a lot of cuing for encouragement and sequencing  Ambulation/Gait Ambulation/Gait assistance: Min guard Ambulation Distance (Feet): 40 Feet Assistive device: Rolling walker (2 wheeled)       General Gait Details: Pt is able to do slow and guarded ambulation.  He was able to tolerate WBing through R LE relatively well and generall shows good safety.   Stairs            Wheelchair Mobility    Modified Rankin (Stroke Patients Only)       Balance Overall balance assessment: Modified Independent                                           Pertinent  Vitals/Pain Pain Assessment: 0-10 Pain Score: 4  (increases with R hip movement)    Home Living Family/patient expects to be discharged to:: Private residence Living Arrangements: Spouse/significant other Available Help at Discharge: Family Type of Home: House Home Access:  (1/2 step threshold)     Home Layout: Two level        Prior Function Level of Independence: Independent         Comments: works driving truck, lots of bending/lifting, Psychiatric nurseetc     Hand Dominance        Extremity/Trunk Assessment   Upper Extremity Assessment Upper Extremity Assessment: Overall WFL for tasks assessed    Lower Extremity Assessment Lower Extremity Assessment: RLE deficits/detail RLE Deficits / Details: R LE with expected post-op weakness in hip       Communication   Communication: No difficulties  Cognition Arousal/Alertness: Awake/alert Behavior During Therapy: WFL for tasks assessed/performed Overall Cognitive Status: Within Functional Limits for tasks assessed                      General Comments      Exercises Total Joint Exercises Ankle Circles/Pumps: AROM;10 reps Quad Sets: Strengthening;10 reps Gluteal Sets: Strengthening;10 reps Short Arc Quad: Strengthening;10 reps Heel Slides: Strengthening;10  reps Hip ABduction/ADduction: Strengthening;10 reps Straight Leg Raises:  (uanble)   Assessment/Plan    PT Assessment Patient needs continued PT services  PT Problem List Decreased range of motion;Decreased activity tolerance;Decreased strength;Decreased mobility;Decreased balance;Decreased safety awareness;Decreased knowledge of use of DME;Pain          PT Treatment Interventions DME instruction;Stair training;Gait training;Functional mobility training;Therapeutic activities;Balance training;Therapeutic exercise;Neuromuscular re-education;Patient/family education    PT Goals (Current goals can be found in the Care Plan section)  Acute Rehab PT Goals Patient  Stated Goal: go home PT Goal Formulation: With patient Time For Goal Achievement: 07/17/16 Potential to Achieve Goals: Good    Frequency BID   Barriers to discharge        Co-evaluation               End of Session Equipment Utilized During Treatment: Gait belt Activity Tolerance: Patient limited by fatigue Patient left: with chair alarm set;with call bell/phone within reach;with family/visitor present Nurse Communication: Mobility status         Time: [id number] PT Time Calculation (min) (ACUTE ONLY): 35 min   Charges:   PT Evaluation $PT Eval Low Complexity: 1 Procedure PT Treatments $Therapeutic Exercise: 8-22 mins   PT G Codes:        Malachi ProGalen R Perlie Scheuring, DPT 07/03/2016, 5:55 PM

## 2016-07-04 LAB — CBC
HEMATOCRIT: 36.1 % — AB (ref 40.0–52.0)
HEMOGLOBIN: 12.7 g/dL — AB (ref 13.0–18.0)
MCH: 31.6 pg (ref 26.0–34.0)
MCHC: 35.2 g/dL (ref 32.0–36.0)
MCV: 89.9 fL (ref 80.0–100.0)
Platelets: 241 10*3/uL (ref 150–440)
RBC: 4.01 MIL/uL — AB (ref 4.40–5.90)
RDW: 13.5 % (ref 11.5–14.5)
WBC: 10.4 10*3/uL (ref 3.8–10.6)

## 2016-07-04 LAB — BASIC METABOLIC PANEL
Anion gap: 4 — ABNORMAL LOW (ref 5–15)
BUN: 14 mg/dL (ref 6–20)
CHLORIDE: 105 mmol/L (ref 101–111)
CO2: 28 mmol/L (ref 22–32)
CREATININE: 0.94 mg/dL (ref 0.61–1.24)
Calcium: 8.6 mg/dL — ABNORMAL LOW (ref 8.9–10.3)
GFR calc non Af Amer: >60 mL/min (ref >60)
Glucose, Bld: 106 mg/dL — ABNORMAL HIGH (ref 65–99)
POTASSIUM: 4.1 mmol/L (ref 3.5–5.1)
SODIUM: 137 mmol/L (ref 135–145)

## 2016-07-04 NOTE — Care Management Note (Signed)
Case Management Note  Patient Details  Name: Jake Robinson MRN: [id number] Date of Birth: 01/31/1971  Subjective/Objective:  Patient sitting up in chair at bedside with eyes closed. Spoke with wife, Glennis BrinkRhonda Parks. She is concerned that patient will have difficulty voiding with foley out tomorrow.  Patient was working prior to admission. They prefer to use [facility] for HHPT. [facility]  [phone]. Called Lovenox 40 mg #14 no refills. Patient ahs a rolling walker.                  Action/Plan: Referral to [facility] for HHPT. Lovenox called in  Expected Discharge Date:    07/05/2016              Expected Discharge Plan:  Home w Home Health Services  In-House Referral:     Discharge planning Services  CM Consult  Post Acute Care Choice:  Home Health Choice offered to:  Spouse  DME Arranged:    DME Agency:     HH Arranged:  PT HH Agency:  [facility] ([facility])  Status of Service:  In process, will continue to follow  If discussed at Long Length of Stay Meetings, dates discussed:    Additional Comments:  Marily MemosLisa M Galilea Quito, RN 07/04/2016, 10:54 AM

## 2016-07-04 NOTE — Progress Notes (Signed)
Physical Therapy Treatment Patient Details Name: Jake Robinson MRN: [id number] DOB: 07/22/1970 Today's Date: 07/04/2016    History of Present Illness 45 y/o male s/p R total hip replacement 12/27, posterior approach.    PT Comments    Pt continues to work hard and be motivated.  He has some general guarding and hesitation with most things involving the R hip, but overall is doing well.  He is able to do some supported AROM hip flexion (heel slides) but has almost no ability to assist with SLRs.  Pt walking quite well (plan to try steps this afternoon) but does remain very stiff in the R LE.  Overall pt improving nicely.   Follow Up Recommendations  Home health PT     Equipment Recommendations   (has RW)    Recommendations for Other Services       Precautions / Restrictions Precautions Precautions: Posterior Hip Precaution Booklet Issued: Yes (comment) Restrictions Weight Bearing Restrictions: Yes RLE Weight Bearing: Weight bearing as tolerated    Mobility  Bed Mobility Overal bed mobility: Needs Assistance Bed Mobility: Supine to Sit     Supine to sit: Min assist     General bed mobility comments: Pt still somewhat guarded during the transition and requests light help guiding R LE off EOB.  He does all the shifting and trunk lifting on his own however.  Transfers Overall transfer level: Modified independent Equipment used: Rolling walker (2 wheeled)             General transfer comment: Pt again needing some cuing and reinforcement, but was able to rise w/o direct assist   Ambulation/Gait Ambulation/Gait assistance: Min guard Ambulation Distance (Feet): 85 Feet Assistive device: Rolling walker (2 wheeled)       General Gait Details: Pt with improved cadence (nearly = b/l) and speed and though he continues to have very stiff, guarded posturing he overall did well and was able to maintain forward walker momentum a majority of the bout of ambulation.     Stairs            Wheelchair Mobility    Modified Rankin (Stroke Patients Only)       Balance Overall balance assessment: Modified Independent                                  Cognition Arousal/Alertness: Awake/alert Behavior During Therapy: WFL for tasks assessed/performed Overall Cognitive Status: Within Functional Limits for tasks assessed                      Exercises Total Joint Exercises Ankle Circles/Pumps: 10 reps;AROM Quad Sets: 15 reps;Strengthening Gluteal Sets: 15 reps;Strengthening Short Arc Quad: 15 reps;Strengthening Heel Slides: 10 reps;Strengthening Hip ABduction/ADduction: 10 reps;Strengthening Straight Leg Raises: PROM;5 reps    General Comments        Pertinent Vitals/Pain Pain Assessment: 0-10 Pain Score: 3  (pain does increase with increased activity)    Home Living Family/patient expects to be discharged to:: Private residence Living Arrangements: Spouse/significant other Available Help at Discharge: Family Type of Home: House Home Access:  (Has a threshold t to enter)   Home Layout: Two level        Prior Function Level of Independence: Independent      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   PT Goals (current goals can now be found in the care plan section) Acute Rehab PT  Goals Patient Stated Goal: To retrun home Progress towards PT goals: Progressing toward goals    Frequency    BID      PT Plan Current plan remains appropriate    Co-evaluation             End of Session Equipment Utilized During Treatment: Gait belt Activity Tolerance: Patient tolerated treatment well Patient left: with chair alarm set;with call bell/phone within reach     Time: 0859-0926 PT Time Calculation (min) (ACUTE ONLY): 27 min  Charges:  $Gait Training: 8-22 mins $Therapeutic Exercise: 8-22 mins                    G Codes:      Jake ProGalen R Zipporah Robinson, DPT 07/04/2016, 12:36 PM

## 2016-07-04 NOTE — Progress Notes (Signed)
Physical Therapy Treatment Patient Details Name: Jake Robinson MRN: [id number] DOB: 03/24/1971 Today's Date: 07/04/2016    History of Present Illness 45 y/o male s/p R total hip replacement 12/27, posterior approach.    PT Comments    Pt shows good effort t/o session and generally has made great gains.  He still has a somewhat hesitant/guarded demeanor with mobility and stiffness with gait but overall is at or above typical POD1 goals.  He walked around the nurses station, negotiated a step and is showing good quad control.  Still not close to [facility] and therefore needing light assist with R LE in/out of bed but generally making great functional gains.   Follow Up Recommendations  Home health PT     Equipment Recommendations       Recommendations for Other Services       Precautions / Restrictions Precautions Precautions: Posterior Hip Restrictions RLE Weight Bearing: Weight bearing as tolerated    Mobility  Bed Mobility Overal bed mobility: Needs Assistance Bed Mobility: Sit to Supine       Sit to supine: Min assist   General bed mobility comments: Pt unable to fully lift R LE up into bed, and needed light assist to get leg into bed  Transfers Overall transfer level: Modified independent Equipment used: Rolling walker (2 wheeled)             General transfer comment: Pt able to get to standing w/o assist, continues to need cuing  Ambulation/Gait Ambulation/Gait assistance: Modified independent (Device/Increase time) Ambulation Distance (Feet): 250 Feet Assistive device: Rolling walker (2 wheeled)       General Gait Details: Pt continues to have some stiffness and guarding but was able to lengthen stride length and speed and generally showed very good motivation and effort.   Stairs Stairs: Yes   Stair Management: With walker;Forwards Number of Stairs: 1 General stair comments: Pt able to negotiate up and over a step with no direct assist  Wheelchair  Mobility    Modified Rankin (Stroke Patients Only)       Balance Overall balance assessment: Modified Independent                                  Cognition Arousal/Alertness: Awake/alert Behavior During Therapy: WFL for tasks assessed/performed Overall Cognitive Status: Within Functional Limits for tasks assessed                      Exercises Total Joint Exercises Ankle Circles/Pumps: 10 reps;AROM Quad Sets: 15 reps;Strengthening Gluteal Sets: 15 reps;Strengthening Short Arc Quad: 15 reps;Strengthening Heel Slides: 10 reps;Strengthening Hip ABduction/ADduction: 10 reps;Strengthening Straight Leg Raises: AAROM;5 reps (able to show some against gravity strength, pain/effort )    General Comments        Pertinent Vitals/Pain Pain Assessment: 0-10 Pain Score: 4  (increases with increased activity)    Home Living                      Prior Function            PT Goals (current goals can now be found in the care plan section) Progress towards PT goals: Progressing toward goals    Frequency    BID      PT Plan Current plan remains appropriate    Co-evaluation             End of Session  Equipment Utilized During Treatment: Gait belt Activity Tolerance: Patient tolerated treatment well Patient left: with chair alarm set;with call bell/phone within reach     Time: [id number] PT Time Calculation (min) (ACUTE ONLY): 25 min  Charges:  $Gait Training: 8-22 mins $Therapeutic Exercise: 8-22 mins                    G Codes:      Malachi ProGalen R Roselle Norton, DPT 07/04/2016, 4:33 PM

## 2016-07-04 NOTE — Evaluation (Signed)
Occupational Therapy Evaluation Patient Details Name: Jake Robinson MRN: [id number] DOB: Robinson Today's Date: 07/04/2016    History of Present Illness Pt. is a 45 y.o. male who was [facility] for a posterior THR.   Clinical Impression   Pt. Is a 45 y.o. male who [facility] THR. Pt. presents with weakness, pain, decreased functional mobility, and posterior hip precautions which hinder his ability to complete ADL and IADL tasks. Pt. Could benefit from skilled OT services for ADL training, A/E training, UE there. Ex, there. Act., and pt. education about home modification, and DME. Pt. Plans to return home at discharge.    Follow Up Recommendations  No follow-up OT    Equipment Recommendations       Recommendations for Other Services       Precautions / Restrictions Precautions Precautions: Posterior Hip Restrictions Weight Bearing Restrictions: Yes RLE Weight Bearing: Weight bearing as tolerated                                                     ADL Overall ADL's : Needs assistance/impaired Eating/Feeding: Set up   Grooming: Set up               Lower Body Dressing: Moderate assistance                       Vision     Perception     Praxis      Pertinent Vitals/Pain Pain Assessment: 0-10 Pain Score: 0-No pain     Hand Dominance Right   Extremity/Trunk Assessment Upper Extremity Assessment Upper Extremity Assessment: Overall WFL for tasks assessed           Communication Communication Communication: No difficulties   Cognition Arousal/Alertness: Lethargic Behavior During Therapy: WFL for tasks assessed/performed Overall Cognitive Status: Within Functional Limits for tasks assessed                     General Comments       Exercises       Shoulder Instructions      Home Living Family/patient expects to be discharged to:: Private residence Living  Arrangements: Spouse/significant other Available Help at Discharge: Family Type of Home: House Home Access:  (Has a threshold t to enter)     Home Layout: Two level Alternate Level Stairs-Number of Steps: flight Alternate Level Stairs-Rails: Right Bathroom Shower/Tub: Walk-in shower;Door   Allied Waste IndustriesBathroom Toilet: Standard                Prior Functioning/Environment Level of Independence: Independent        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        OT Problem List: Decreased strength;Decreased activity tolerance;Decreased knowledge of use of DME or AE;Pain   OT Treatment/Interventions: Self-care/ADL training;Therapeutic exercise;Energy conservation;DME and/or AE instruction;Therapeutic activities    OT Goals(Current goals can be found in the care plan section) Acute Rehab OT Goals Patient Stated Goal: To retrun home OT Goal Formulation: With patient Potential to Achieve Goals: Good  OT Frequency: Min 1X/week   Barriers to D/C:            Co-evaluation              End of Session  Activity Tolerance: Patient tolerated treatment well Patient left: in chair;with call bell/phone within reach;with bed alarm set;with chair alarm set   Time: [phone] OT Time Calculation (min): 25 min Charges:  OT General Charges $OT Visit: 1 Procedure OT Evaluation $OT Eval Moderate Complexity: 1 Procedure G-Codes:    Jake MessierElaine Iasiah Ozment, MS, OTR/L 07/04/2016, 11:14 AM

## 2016-07-04 NOTE — Progress Notes (Signed)
ORTHOPAEDICS PROGRESS NOTE  PATIENT NAME: Jake Robinson DOB: 03/28/1971  MRN: [id number]  POD # 1: Right total hip arthroplasty  Subjective: The patient states that he is sore but pain is under good control. He denies any nausea. He ambulated 40 feet with physical therapy yesterday afternoon.  Objective: Vital signs in last 24 hours: Temp:  [97 F (36.1 C)-98.1 F (36.7 C)] 98.1 F (36.7 C) (12/28 0500) Pulse Rate:  [45-62] 54 (12/28 0500) Resp:  [13-20] 16 (12/28 0500) BP: (100-118)/(51-78) 107/65 (12/28 0500) SpO2:  [93 %-100 %] 93 % (12/28 0500)  Intake/Output from previous day: 12/27 0701 - 12/28 0700 In: 4295 [P.O.:600; I.V.:3195; IV Piggyback:500] Out: 3000 [Urine:2415; Drains:385; Blood:200]   Recent Labs  07/04/16 0436  WBC 10.4  HGB 12.7*  HCT 36.1*  PLT 241  K 4.1  CL 105  CO2 28  BUN 14  CREATININE 0.94  GLUCOSE 106*  CALCIUM 8.6*    EXAM General: Well-developed well-nourished male seen in no acute distress. Lungs: clear to auscultation Cardiac: normal rate, regular rhythm, normal S1, S2, no murmurs, rubs, clicks or gallops Right lower extremity: Right hip dressing is dry and intact. Hemovac drain is in place. No significant swelling to the thigh. Homans test is negative. Neurologic: Awake, alert, and oriented. Sensory and motor function are intact.  Assessment: Status post right total hip arthroplasty  Secondary diagnosis: Multiple PVCs noted intraoperatively and preoperatively Difficult Foley insertion preoperatively (a 12 French catheter was ultimately used after blood was expressed following attempts with larger catheters and a Coude catheter)  Plan: The patient is doing extremely well present time. As per recommendations by the urologist yesterday, I will leave the Foley catheter in place today with plans to discontinue tomorrow prior to discharge. Urine has been clear. The patient was informed. Today's goal were reviewed with the patient.   Continue with physical therapy and occupational therapy as per total hip arthroplasty rehabilitation protocol. Plan is to go Home after hospital stay. DVT Prophylaxis - Lovenox, Foot Pumps and TED hose  Mansur Patti P. Angie FavaHooten, Jr. M.D.

## 2016-07-04 NOTE — Anesthesia Postprocedure Evaluation (Signed)
Anesthesia Post Note  Patient: Jake Robinson  Procedure(s) Performed: Procedure(s) (LRB): TOTAL HIP ARTHROPLASTY (Right)  Patient location during evaluation: Nursing Unit Anesthesia Type: Spinal Level of consciousness: oriented and awake and alert Pain management: pain level controlled Vital Signs Assessment: post-procedure vital signs reviewed and stable Respiratory status: spontaneous breathing, respiratory function stable and patient connected to nasal cannula oxygen Cardiovascular status: blood pressure returned to baseline and stable Postop Assessment: no headache and no backache Anesthetic complications: no     Last Vitals:  Vitals:   07/04/16 0015 07/04/16 0500  BP: 107/62 107/65  Pulse: (!) 53 (!) 54  Resp: 16 16  Temp: 36.7 C 36.7 C    Last Pain:  Vitals:   07/04/16 0644  TempSrc:   PainSc: Asleep                 Jules SchickLogan,  Rabia Argote P

## 2016-07-04 NOTE — Progress Notes (Signed)
Clinical Social Worker (CSW) received SNF consult. PT is recommending home health. RN case manager aware of above. Please reconsult if future social work needs arise. CSW signing off.   Sruthi Maurer, LCSW [phone] 

## 2016-07-05 LAB — BASIC METABOLIC PANEL
ANION GAP: 6 (ref 5–15)
BUN: 13 mg/dL (ref 6–20)
CALCIUM: 8.8 mg/dL — AB (ref 8.9–10.3)
CO2: 27 mmol/L (ref 22–32)
Chloride: 104 mmol/L (ref 101–111)
Creatinine, Ser: 0.95 mg/dL (ref 0.61–1.24)
Glucose, Bld: 125 mg/dL — ABNORMAL HIGH (ref 65–99)
Potassium: 3.9 mmol/L (ref 3.5–5.1)
Sodium: 137 mmol/L (ref 135–145)

## 2016-07-05 LAB — CBC
HEMATOCRIT: 37.4 % — AB (ref 40.0–52.0)
Hemoglobin: 13.4 g/dL (ref 13.0–18.0)
MCH: 31.8 pg (ref 26.0–34.0)
MCHC: 35.8 g/dL (ref 32.0–36.0)
MCV: 88.9 fL (ref 80.0–100.0)
PLATELETS: 261 10*3/uL (ref 150–440)
RBC: 4.21 MIL/uL — ABNORMAL LOW (ref 4.40–5.90)
RDW: 13.1 % (ref 11.5–14.5)
WBC: 11.1 10*3/uL — AB (ref 3.8–10.6)

## 2016-07-05 LAB — SURGICAL PATHOLOGY

## 2016-07-05 MED ORDER — ENOXAPARIN SODIUM 40 MG/0.4ML ~~LOC~~ SOLN
40.0000 mg | SUBCUTANEOUS | 0 refills | Status: AC
Start: 2016-07-05 — End: ?

## 2016-07-05 MED ORDER — TRAMADOL HCL 50 MG PO TABS: 50.0000 mg | ORAL_TABLET | ORAL | 0 refills | Status: AC | PRN

## 2016-07-05 MED ORDER — LACTULOSE 10 GM/15ML PO SOLN
10.0000 g | Freq: Two times a day (BID) | ORAL | Status: DC | PRN
Start: 2016-07-05 — End: 2016-07-05
  Administered 2016-07-05: 10 g via ORAL
  Filled 2016-07-05: qty 30

## 2016-07-05 MED ORDER — OXYCODONE HCL 5 MG PO TABS: 5.0000 mg | ORAL_TABLET | ORAL | 0 refills | Status: AC | PRN

## 2016-07-05 NOTE — Progress Notes (Signed)
Foley removed at 0530.

## 2016-07-05 NOTE — Progress Notes (Signed)
Pt being discharged home today. PIV removed, dressing was changed. Discharge instructions reviewed with pt and family, all questions were answered. He was given his prescriptions to have filled. He will have home health start over the weekend. He is leaving with all his belongings. Will be transported home via family.

## 2016-07-05 NOTE — Progress Notes (Signed)
Physical Therapy Treatment Patient Details Name: Jake Robinson MRN: [id number] DOB: 04/28/1971 Today's Date: 07/05/2016    History of Present Illness 45 y/o male s/p R total hip replacement 12/27, posterior approach.    PT Comments    Pt progressing well towards goals.  Pt continues to require minimal assist with bed mobility secondary to weakness in RLE hip flexors.  Pt able to transfer independently with min verbal cues for sequencing to maintain hip precautions.  Pt able to amb 250' with Mod I using a RW and was able to ascend/descend 4 steps with B rails and SBA.  Pt/spouse education provided on maintaining R posterior hip precautions during transfers, bed mobility, and gait.  Pt will benefit from HHPT to continue addressing deficits in strength and to ensure posterior hip precautions maintained in the home environment.    Follow Up Recommendations  Home health PT     Equipment Recommendations       Recommendations for Other Services       Precautions / Restrictions Precautions Precautions: Posterior Hip Precaution Booklet Issued: Yes (comment) Precaution Comments: Posterior hip precaution education provided with pt able to verbalize 1/3 precautions pre-education.  Restrictions Weight Bearing Restrictions: Yes RLE Weight Bearing: Weight bearing as tolerated    Mobility  Bed Mobility Overal bed mobility: Needs Assistance Bed Mobility: Supine to Sit;Sit to Supine     Supine to sit: Min assist Sit to supine: Min assist   General bed mobility comments: Pt unable to fully lift RLE in/out of bed and needed light assist  Transfers Overall transfer level: Modified independent Equipment used: Rolling walker (2 wheeled)             General transfer comment: Pt able to get to standing w/o assist, continues to need cuing for proper sequencing to maintain hip precautions; pt/spouse education provided on proper sequencing and on safe transfers in home environment    Ambulation/Gait Ambulation/Gait assistance: Modified independent (Device/Increase time) Ambulation Distance (Feet): 250 Feet Assistive device: Rolling walker (2 wheeled) Gait Pattern/deviations: Decreased step length - left;Step-through pattern;Antalgic   Gait velocity interpretation: Below normal speed for age/gender General Gait Details: Pt/spouse education via demonstration and then pt practice on making R turns while avoiding closed kinetic chain R hip IR   Stairs Stairs: Yes   Stair Management: Two rails Number of Stairs: 4 General stair comments: Step training with pt/spouse up/down one step with B rails and then 4 steps with B rails with min verbal cues for proper sequencing.   Wheelchair Mobility    Modified Rankin (Stroke Patients Only)       Balance Overall balance assessment: Modified Independent                                  Cognition Arousal/Alertness: Awake/alert Behavior During Therapy: WFL for tasks assessed/performed Overall Cognitive Status: Within Functional Limits for tasks assessed                      Exercises Other Exercises Other Exercises: Static balance training with feet apart, together, and semi-tandem with combinations of eyes open/closed and head still/head turns with good stability grossly    General Comments        Pertinent Vitals/Pain Pain Assessment: 0-10 Pain Score: 3  Pain Location: R hip    Home Living  Prior Function            PT Goals (current goals can now be found in the care plan section) Progress towards PT goals: Progressing toward goals    Frequency    BID      PT Plan Current plan remains appropriate    Co-evaluation             End of Session Equipment Utilized During Treatment: Gait belt Activity Tolerance: Patient tolerated treatment well Patient left: in chair;with call bell/phone within reach;with family/visitor present      Time: [id number] PT Time Calculation (min) (ACUTE ONLY): 41 min  Charges:  $Gait Training: 8-22 mins $Therapeutic Exercise: 8-22 mins $Therapeutic Activity: 8-22 mins                    G Codes:      Elly Modena. Scott Boe Deans PT, DPT 07/05/16, 12:17 PM

## 2016-07-05 NOTE — Progress Notes (Addendum)
   Subjective: 2 Days Post-Op Procedure(s) (LRB): TOTAL HIP ARTHROPLASTY (Right) Patient reports pain as mild.   Patient is well, and has had no acute complaints or problems Continue with physical therapy today.  Plan is to go Home after hospital stay. no nausea and no vomiting Patient denies any chest pains or shortness of breath. Objective: Vital signs in last 24 hours: Temp:  [98 F (36.7 C)-99 F (37.2 C)] 99 F (37.2 C) (12/29 0415) Pulse Rate:  [43-63] 43 (12/29 0415) Resp:  [16-18] 16 (12/29 0415) BP: (114-133)/(56-78) 133/59 (12/29 0415) SpO2:  [91 %-98 %] 91 % (12/29 0415) well approximated incision Heels are non tender and elevated off the bed using rolled towels Intake/Output from previous day: 12/28 0701 - 12/29 0700 In: 480 [P.O.:480] Out: 2900 [Urine:2750; Drains:150] Intake/Output this shift: Total I/O In: -  Out: 1650 [Urine:1500; Drains:150]   Recent Labs  07/04/16 0436 07/05/16 0423  HGB 12.7* 13.4    Recent Labs  07/04/16 0436 07/05/16 0423  WBC 10.4 11.1*  RBC 4.01* 4.21*  HCT 36.1* 37.4*  PLT 241 261    Recent Labs  07/04/16 0436 07/05/16 0423  NA 137 137  K 4.1 3.9  CL 105 104  CO2 28 27  BUN 14 13  CREATININE 0.94 0.95  GLUCOSE 106* 125*  CALCIUM 8.6* 8.8*   No results for input(s): LABPT, INR in the last 72 hours.  EXAM General - Patient is Alert, Appropriate and Oriented Extremity - Neurologically intact Neurovascular intact Sensation intact distally Intact pulses distally Dorsiflexion/Plantar flexion intact No cellulitis present Compartment soft Dressing - dressing C/D/I Motor Function - intact, moving foot and toes well on exam.    Past Medical History:  Diagnosis Date  . Arthritis     Assessment/Plan: 2 Days Post-Op Procedure(s) (LRB): TOTAL HIP ARTHROPLASTY (Right) Active Problems:   S/P total hip arthroplasty  Estimated body mass index is 28.48 kg/m as calculated from the following:   Height as of  this encounter: 6' (1.829 m).   Weight as of this encounter: 95.3 kg (210 lb). Up with therapy Discharge home with home health  Labs: Were reviewed DVT Prophylaxis - Lovenox, Foot Pumps and TED hose Weight-Bearing as tolerated to right leg Hemovac discontinued on today's visit Foley to be removed this morning. This was left in place for an extra 24 hours secondary to difficulty with catheterization. Patient needs to have a bowel movement Please change dressing prior to patient being discharged  Jon R. [facility] 07/05/2016, 5:33 AM

## 2016-07-05 NOTE — Care Management Note (Signed)
Case Management Note  Patient Details  Name: Jake Robinson MRN: [id number] Date of Birth: 05/14/1971  Subjective/Objective:  Cost of Lovenox is $12. Patient updated.                 Action/Plan: [facility] notified of discharge  Expected Discharge Date:    07/05/2016              Expected Discharge Plan:  Home w Home Health Services  In-House Referral:     Discharge planning Services  CM Consult  Post Acute Care Choice:  Home Health Choice offered to:  Spouse  DME Arranged:    DME Agency:     HH Arranged:  PT HH Agency:  [facility]  Status of Service:  Completed, signed off  If discussed at [employer] Length of [employer] Meetings, dates discussed:    Additional Comments:  Marily MemosLisa M Chloee Tena, RN 07/05/2016, 9:04 AM

## 2016-07-05 NOTE — Discharge Instructions (Signed)

## 2016-07-05 NOTE — Discharge Summary (Addendum)
Physician Discharge Summary  Patient ID: Jake Robinson MRN: [id number] DOB/AGE: 47/01/1971 47 y.o.  Admit date: 07/03/2016 Discharge date: 07/05/2016  Admission Diagnoses:  PRIMARY OSTEOARTHRITIS   Discharge Diagnoses: Patient Active Problem List   Diagnosis Date Noted  . 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 Arthritis      Transfusion: No transfusions given doing this admission   Consultants (if any):  case management for home health assistance  Discharged Condition: Improved  Hospital Course: Jake Robinson is an 45 y.o. male who was admitted 07/03/2016 with a diagnosis of degenerative arthrosis right hip and went to the operating room on 07/03/2016 and underwent the above named procedures.    Surgeries:Procedure(s): TOTAL HIP ARTHROPLASTY on 07/03/2016  PRE-OPERATIVE DIAGNOSIS: Degenerative arthrosis of the right hip, primary  POST-OPERATIVE DIAGNOSIS:  Same  PROCEDURE:  Right total hip arthroplasty  SURGEON:  Jena GaussJames P Elloise Roark, Jr. M.D.  ASSISTANT:  Van ClinesJon Wolfe, PA (present and scrubbed throughout the case, critical for assistance with exposure, retraction, instrumentation, and closure)  ANESTHESIA: spinal  ESTIMATED BLOOD LOSS: 200 mL  FLUIDS REPLACED: 1700 mL of crystalloid  DRAINS: 2 medium drains to a Hemovac reservoir  IMPLANTS UTILIZED: DePuy 15 mm small stature AML femoral stem, 56 mm OD Pinnacle 100 acetabular component, neutral Pinnacle Marathon polyethylene insert, and a 36 mm M-SPEC +1.5 mm hip ball  INDICATIONS FOR SURGERY: Jake Robinson is a 45 y.o. year old male with a long history of progressive hip and groin  pain. X-rays demonstrated severe degenerative changes. The patient had not seen any significant improvement despite conservative nonsurgical intervention. After discussion of the risks and benefits of surgical intervention, the patient expressed understanding of the risks benefits and agree with  plans for total hip arthroplasty.   The risks, benefits, and alternatives were discussed at length including but not limited to the risks of infection, bleeding, nerve injury, stiffness, blood clots, the need for revision surgery, limb length inequality, dislocation, cardiopulmonary complications, among others, and they were willing to proceed.  Patient tolerated the surgery well. No complications .Patient was taken to PACU where she was stabilized and then transferred to the orthopedic floor.  Patient started on Lovenox 30 mg q 12 hrs. Foot pumps applied bilaterally at 80 mm hgb. Heels elevated off bed with rolled towels. No evidence of DVT. Calves non tender. Negative Homan. Physical therapy started on day #1 for gait training and transfer with OT starting on  day #1 for ADL and assisted devices. Patient has done well with therapy. Ambulated greater than 200 feet upon being discharged. Was able to ascend and descend 4 steps safely and independently  Patient's IV was discontinued on day #1 with the Hemovac and Foley being discontinued on day #2. Foley was left in extra 24 hours due to complications with catheterization at the time of surgery. Dressing was changed on day #2 prior to patient being discharged   He was given perioperative antibiotics:  Anti-infectives    Start     Dose/Rate Route Frequency Ordered Stop   07/03/16 1230  ceFAZolin (ANCEF) IVPB 2g/100 mL premix     2 g 200 mL/hr over 30 Minutes Intravenous Every 6 hours 07/03/16 1217 07/04/16 1229   07/03/16 0600  ceFAZolin (ANCEF) IVPB 2g/100 mL premix     2 g 200 mL/hr over 30 Minutes Intravenous On call to O.R. 07/02/16 2209 07/03/16 0820    .  He was fitted with AV 1 compression  foot pump devices, instructed on heel pumps, early ambulation, and TED stockings bilaterally for DVT prophylaxis.  He benefited maximally from the hospital stay and there were no complications.    Recent vital signs:  Vitals:   07/04/16 1951  07/05/16 0415  BP: 127/60 (!) 133/59  Pulse: (!) 51 (!) 43  Resp: 16 16  Temp: 99 F (37.2 C) 99 F (37.2 C)    Recent laboratory studies:  Lab Results  Component Value Date   HGB 13.4 07/05/2016   HGB 12.7 (L) 07/04/2016   HGB 15.7 06/19/2016   Lab Results  Component Value Date   WBC 11.1 (H) 07/05/2016   PLT 261 07/05/2016   Lab Results  Component Value Date   INR 0.97 06/19/2016   Lab Results  Component Value Date   NA 137 07/05/2016   K 3.9 07/05/2016   CL 104 07/05/2016   CO2 27 07/05/2016   BUN 13 07/05/2016   CREATININE 0.95 07/05/2016   GLUCOSE 125 (H) 07/05/2016    Discharge Medications:   Allergies as of 07/05/2016   No Known Allergies     Medication List    TAKE these medications   allopurinol 300 MG tablet Commonly known as:  ZYLOPRIM Take 300 mg by mouth daily after supper.   enoxaparin 40 MG/0.4ML injection Commonly known as:  LOVENOX Inject 0.4 mLs (40 mg total) into the skin daily.   naproxen sodium 220 MG tablet Commonly known as:  ANAPROX Take 220-440 mg by mouth daily.   oxyCODONE 5 MG immediate release tablet Commonly known as:  Oxy IR/ROXICODONE Take 1-2 tablets (5-10 mg total) by mouth every 4 (four) hours as needed for severe pain.   traMADol 50 MG tablet Commonly known as:  ULTRAM Take 1-2 tablets (50-100 mg total) by mouth every 4 (four) hours as needed for moderate pain.            Durable Medical Equipment        Start     Ordered   07/03/16 1218  DME Walker rolling  Once    Question:  Patient needs a walker to treat with the following condition  Answer:  S/P total hip arthroplasty   07/03/16 1217   07/03/16 1218  DME Bedside commode  Once    Question:  Patient needs a bedside commode to treat with the following condition  Answer:  S/P total hip arthroplasty   07/03/16 1217      Diagnostic Studies: Dg Hip Port Unilat With Pelvis 1v Right  Result Date: 07/03/2016 CLINICAL DATA:  Post right total hip  replacement EXAM: DG HIP (WITH OR WITHOUT PELVIS) 1V PORT RIGHT COMPARISON:  None. FINDINGS: Two views of the right hip submitted. There is right hip prosthesis with anatomic alignment. Postsurgical changes are noted with lateral skin staples. Postsurgical drain in place. IMPRESSION: Right hip prosthesis with anatomic alignment. Electronically Signed   By: Natasha MeadLiviu  Pop M.D.   On: 07/03/2016 11:34    Disposition: Final discharge disposition not confirmed  Discharge Instructions    Diet - low sodium heart healthy    Complete by:  As directed    Increase activity slowly    Complete by:  As directed       Follow-up Information    Donato HeinzHOOTEN,Kaiulani Sitton P, MD On 08/15/2016.   Specialty:  Orthopedic Surgery Why:  at 10:45am Contact information: [address] [facility] [place] [id number] [phone]  Signed: WOLFE,JON R. 07/05/2016, 5:39 AM

## 2018-06-20 IMAGING — DX DG HIP (WITH OR WITHOUT PELVIS) 1V PORT*R*
2 series · 2 of 2 positions shown · non-contrast
Comparison: None.

CLINICAL DATA: Post right total hip replacement

EXAM:
DG HIP (WITH OR WITHOUT PELVIS) 1V PORT RIGHT

[hip ap]
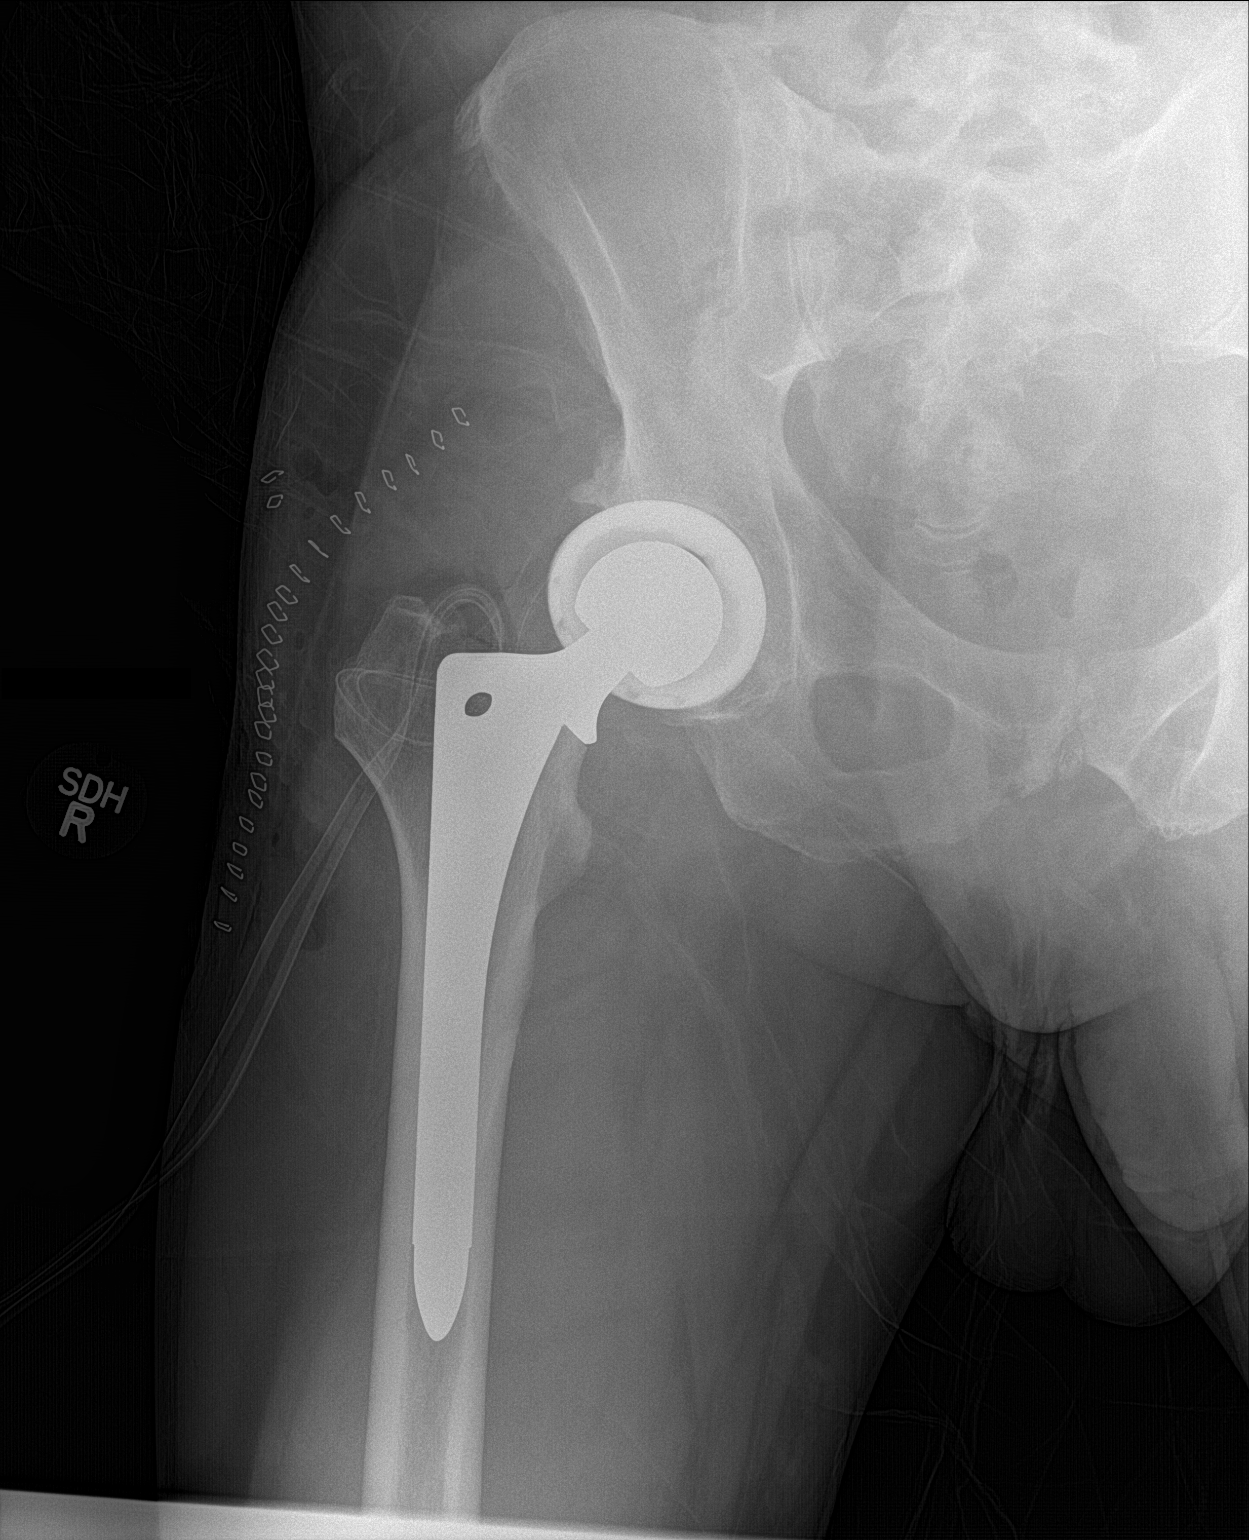

[hip lat]
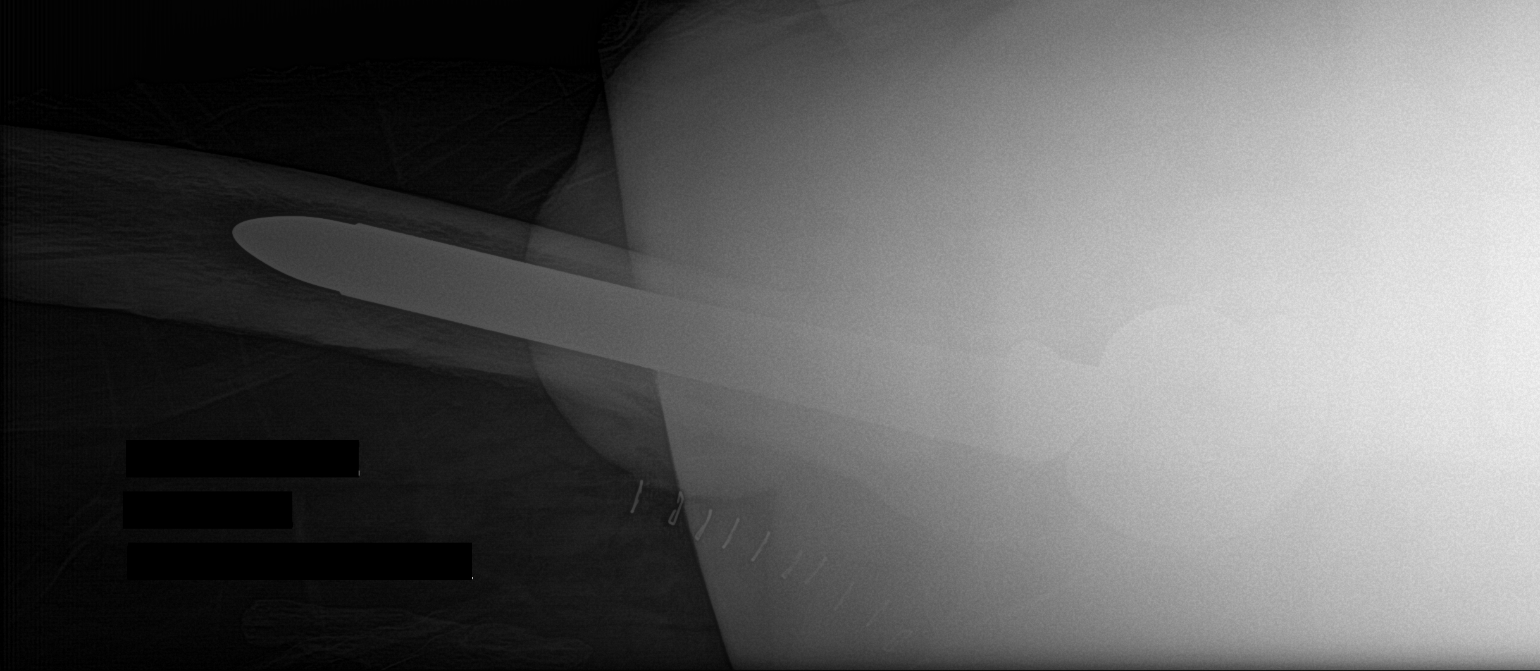

[2 of 2 positions shown; findings below may reference images not displayed]

FINDINGS: Two views of the right hip submitted. There is right hip prosthesis
with anatomic alignment. Postsurgical changes are noted with lateral
skin staples. Postsurgical drain in place.
IMPRESSION: Right hip prosthesis with anatomic alignment.

## 2022-02-27 ENCOUNTER — Other Ambulatory Visit (HOSPITAL_COMMUNITY): Payer: Self-pay | Admitting: Nephrology

## 2022-02-27 ENCOUNTER — Other Ambulatory Visit: Payer: Self-pay | Admitting: Nephrology

## 2022-02-27 DIAGNOSIS — N1831 Chronic kidney disease, stage 3a: Secondary | ICD-10-CM

## 2022-03-19 ENCOUNTER — Ambulatory Visit
Admission: RE | Admit: 2022-03-19 | Discharge: 2022-03-19 | Disposition: A | Discharge status: Home / Self Care | Payer: Managed Care, Other (non HMO) | Source: Ambulatory Visit | Attending: Nephrology | Admitting: Nephrology

## 2022-03-19 DIAGNOSIS — N1831 Chronic kidney disease, stage 3a: Secondary | ICD-10-CM | POA: Diagnosis present

## 2023-03-28 ENCOUNTER — Ambulatory Visit: Payer: Managed Care, Other (non HMO)

## 2023-03-28 DIAGNOSIS — Z86010 Personal history of colonic polyps: Secondary | ICD-10-CM | POA: Diagnosis not present

## 2023-03-28 DIAGNOSIS — K640 First degree hemorrhoids: Secondary | ICD-10-CM | POA: Diagnosis not present

## 2023-03-28 DIAGNOSIS — Z1211 Encounter for screening for malignant neoplasm of colon: Secondary | ICD-10-CM | POA: Diagnosis present

## 2023-03-28 DIAGNOSIS — K621 Rectal polyp: Secondary | ICD-10-CM | POA: Diagnosis not present
